# Patient Record
Sex: Female | Born: 1990 | Race: White | Hispanic: No | Marital: Single | Smoking: Never smoker
Health system: Southern US, Community
[De-identification: ages and names within clinical notes are randomized; demographics above are authoritative.]

## PROBLEM LIST (undated history)

## (undated) DIAGNOSIS — F410 Panic disorder [episodic paroxysmal anxiety] without agoraphobia: Secondary | ICD-10-CM

## (undated) DIAGNOSIS — F419 Anxiety disorder, unspecified: Secondary | ICD-10-CM

## (undated) DIAGNOSIS — G4700 Insomnia, unspecified: Secondary | ICD-10-CM

## (undated) DIAGNOSIS — F909 Attention-deficit hyperactivity disorder, unspecified type: Secondary | ICD-10-CM

## (undated) DIAGNOSIS — K5900 Constipation, unspecified: Secondary | ICD-10-CM

## (undated) DIAGNOSIS — N83209 Unspecified ovarian cyst, unspecified side: Secondary | ICD-10-CM

## (undated) DIAGNOSIS — F32A Depression, unspecified: Secondary | ICD-10-CM

## (undated) HISTORY — PX: APPENDECTOMY: SHX54

## (undated) HISTORY — PX: ABDOMINAL SURGERY: SHX537

## (undated) HISTORY — PX: APPENDECTOMY (OPEN): SHX54

## (undated) HISTORY — DX: Depression, unspecified: F32.A

## (undated) HISTORY — DX: Attention-deficit hyperactivity disorder, unspecified type: F90.9

---
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
## 2011-01-29 DIAGNOSIS — N949 Unspecified condition associated with female genital organs and menstrual cycle: Secondary | ICD-10-CM | POA: Insufficient documentation

## 2011-01-30 ENCOUNTER — Emergency Department (INDEPENDENT_AMBULATORY_CARE_PROVIDER_SITE_OTHER): Payer: BC Managed Care – PPO

## 2011-01-30 ENCOUNTER — Emergency Department (HOSPITAL_BASED_OUTPATIENT_CLINIC_OR_DEPARTMENT_OTHER)
Admission: EM | Admit: 2011-01-30 | Discharge: 2011-01-30 | Disposition: A | Discharge status: Home / Self Care | Payer: BC Managed Care – PPO | Attending: Emergency Medicine | Admitting: Emergency Medicine

## 2011-01-30 ENCOUNTER — Encounter: Payer: Self-pay | Admitting: *Deleted

## 2011-01-30 DIAGNOSIS — N949 Unspecified condition associated with female genital organs and menstrual cycle: Secondary | ICD-10-CM

## 2011-01-30 DIAGNOSIS — R102 Pelvic and perineal pain: Secondary | ICD-10-CM

## 2011-01-30 DIAGNOSIS — Z8742 Personal history of other diseases of the female genital tract: Secondary | ICD-10-CM

## 2011-01-30 HISTORY — DX: Unspecified ovarian cyst, unspecified side: N83.209

## 2011-01-30 LAB — URINALYSIS, ROUTINE W REFLEX MICROSCOPIC
Bilirubin Urine: NEGATIVE
Glucose, UA: NEGATIVE mg/dL
Ketones, ur: NEGATIVE mg/dL
pH: 6.5 (ref 5.0–8.0)

## 2011-01-30 LAB — WET PREP, GENITAL
Trich, Wet Prep: NONE SEEN
Yeast Wet Prep HPF POC: NONE SEEN

## 2011-01-30 LAB — PREGNANCY, URINE: Preg Test, Ur: NEGATIVE

## 2011-01-30 MED ORDER — MORPHINE SULFATE 4 MG/ML IJ SOLN
4.0000 mg | Freq: Once | INTRAMUSCULAR | Status: AC
  Administered 2011-01-30: 4 mg via INTRAVENOUS
  Filled 2011-01-30: qty 1

## 2011-01-30 MED ORDER — MORPHINE SULFATE 4 MG/ML IJ SOLN
4.0000 mg | Freq: Once | INTRAMUSCULAR | Status: AC
  Administered 2011-01-30: 4 mg via INTRAVENOUS

## 2011-01-30 MED ORDER — ONDANSETRON HCL 4 MG/2ML IJ SOLN
4.0000 mg | Freq: Once | INTRAMUSCULAR | Status: AC
  Administered 2011-01-30: 4 mg via INTRAVENOUS

## 2011-01-30 MED ORDER — HYDROCODONE-ACETAMINOPHEN 5-500 MG PO TABS: 1.0000 | ORAL_TABLET | Freq: Four times a day (QID) | ORAL | Status: AC | PRN

## 2011-01-30 MED ORDER — ONDANSETRON HCL 4 MG/2ML IJ SOLN
INTRAMUSCULAR | Status: AC
  Filled 2011-01-30: qty 2

## 2011-01-30 MED ORDER — ONDANSETRON 8 MG PO TBDP: 8.0000 mg | ORAL_TABLET | Freq: Three times a day (TID) | ORAL | Status: AC | PRN

## 2011-01-30 MED ORDER — MORPHINE SULFATE 4 MG/ML IJ SOLN
INTRAMUSCULAR | Status: AC
  Filled 2011-01-30: qty 1

## 2011-01-30 NOTE — ED Provider Notes (Signed)
History     CSN: [id number] Arrival date & time: 01/30/2011 12:29 AM   First MD Initiated Contact with Patient 01/30/11 [id number]      Chief Complaint  Patient presents with  . Abdominal Pain    (Consider location/radiation/quality/duration/timing/severity/associated sxs/prior treatment) HPI Complains of low abdominal pain gradual in onset yesterday afternoon. Associated symptoms are  nausea no fever no anorexia pain moderate to severe quality of pain is similar to prior ovarian cysts or endometriosis nonradiating no vaginal discharge treated with ibuprofen with partial relief. Nothing makes symptoms better or worse. No other associated symptoms Past Medical History  Diagnosis Date  . Ovarian cyst   . Endometriosis     Past Surgical History  Procedure Date  . Appendectomy   . Abdominal surgery     for endometriosis    No family history on file.  History  Substance Use Topics  . Smoking status: Never Smoker   . Smokeless tobacco: Not on file  . Alcohol Use: Yes     occasional    OB History    Grav Para Term Preterm Abortions TAB SAB Ect Mult Living                  Review of Systems  Constitutional: Negative.   HENT: Negative.   Respiratory: Negative.   Cardiovascular: Negative.   Gastrointestinal: Positive for abdominal pain.  Musculoskeletal: Negative.   Skin: Negative.   Neurological: Negative.   Hematological: Negative.   Psychiatric/Behavioral: Negative.   All other systems reviewed and are negative.    Allergies  Oxycodone  Home Medications   Current Outpatient Rx  Name Route Sig Dispense Refill  . LEVONORGEST-ETH ESTRAD 91-DAY 0.15-0.03 MG PO TABS Oral Take 1 tablet by mouth daily.        BP 114/74  Pulse 67  Temp(Src) 98.5 F (36.9 C) (Oral)  Resp 16  Ht 5\' 4"  (1.626 m)  Wt 140 lb (63.504 kg)  BMI 24.03 kg/m2  SpO2 100%  LMP 01/17/2011  Physical Exam  Nursing note and vitals reviewed. Constitutional: She appears well-developed and  well-nourished.  HENT:  Head: Normocephalic and atraumatic.  Eyes: Conjunctivae are normal. Pupils are equal, round, and reactive to light.  Neck: Neck supple. No tracheal deviation present. No thyromegaly present.  Cardiovascular: Normal rate and regular rhythm.   No murmur heard. Pulmonary/Chest: Effort normal and breath sounds normal.  Abdominal: Soft. Bowel sounds are normal. She exhibits no distension. There is no tenderness.  Genitourinary:       No external lesion tiny amount of blood in vaginal vault cervical os closed no cervical motion tenderness bilateral adnexal tenderness no definite masses  Musculoskeletal: Normal range of motion. She exhibits no edema and no tenderness.  Neurological: She is alert. Coordination normal.  Skin: Skin is warm and dry. No rash noted.  Psychiatric: She has a normal mood and affect.    ED Course  Procedures (including critical care time) 3:50 AM pain and nausea improved after treatment with intravenous morphine and Zofran. Patient resting comfortably  Labs Reviewed  PREGNANCY, URINE  URINALYSIS, ROUTINE W REFLEX MICROSCOPIC   No results found.   No diagnosis found.  7:45 AM Patient resting comfortably after treatment with intravenous morphine . Declines further pain medicine presently. Nausea has resolved  MDM  Pelvic ultrasound ordered to rule out infection cyst or torsion Patient signed out to Dr.Ray 7:45 AM Diagnosis pelvic pain        Doug Sou, MD 01/30/11 [phone]

## 2011-01-30 NOTE — ED Notes (Signed)
Visitor out in hall asking for help this RN in to check on pt pt sitting up in bed tearful stating" Im freaking out Im having a panic attack I need someone to stand in here" reassurance given to pt stood at bedside with pt comfort and reassurance offered.Marland Kitchen

## 2011-01-30 NOTE — ED Notes (Signed)
Pt medicated for pain per MD order- pending pelvic exam

## 2011-01-30 NOTE — ED Notes (Signed)
Pt reports lower abd pain since 1200- hx of ovarian cyst- nausea

## 2011-02-17 ENCOUNTER — Emergency Department
Admission: EM | Admit: 2011-02-17 | Disposition: A | Discharge status: Home / Self Care | Payer: Self-pay | Source: Emergency Department | Admitting: Emergency Medicine

## 2011-02-17 LAB — CBC AND DIFFERENTIAL
BASOPHILS %: 0.2 % (ref 0.0–2.0)
Baso(Absolute): 0.03 10*3/uL (ref 0.00–0.20)
Eosinophils %: 0.1 % (ref 0.0–6.0)
Eosinophils Absolute: 0.02 10*3/uL — ABNORMAL LOW (ref 0.10–0.30)
Hematocrit: 40.9 % (ref 27.0–49.5)
Hemoglobin: 14.5 g/dL (ref 11.7–15.5)
Lymphocytes Absolute: 2.08 10*3/uL (ref 1.00–4.80)
Lymphocytes Relative: 12.7 % — ABNORMAL LOW (ref 25.0–55.0)
MCH: 30.5 pg (ref 27.0–34.0)
MCHC: 35.5 g/dL (ref 32.0–36.0)
MCV: 85.9 fL (ref 80–100)
MPV: 8.7 fL — ABNORMAL LOW (ref 9.0–13.0)
Monocytes Absolute: 0.79 10*3/uL (ref 0.10–1.20)
Monocytes Relative %: 4.8 % (ref 1.0–8.0)
Neutrophils Absolute: 13.43 10*3/uL — ABNORMAL HIGH (ref 1.80–7.70)
Neutrophils Relative %: 82.2 % — ABNORMAL HIGH (ref 49.0–69.0)
Platelets: 334 10*3/uL (ref 150–400)
RBC: 4.76 M/uL (ref 3.80–5.40)
RDW: 12.5 % (ref 11.0–14.0)
WBC: 16.35 10*3/uL — ABNORMAL HIGH (ref 4.80–10.80)

## 2011-02-17 LAB — COMPREHENSIVE METABOLIC PANEL
ALT: 29 U/L (ref 7–56)
AST (SGOT): 25 U/L (ref 10–40)
Albumin: 4.5 g/dL (ref 3.9–5.0)
Alkaline Phosphatase: 83 U/L (ref 38–126)
BUN / Creatinine Ratio: 17 (ref 8–20)
BUN: 11 mg/dL (ref 6–20)
Bilirubin, Total: 0.8 mg/dL (ref 0.2–1.3)
CO2: 22 mmol/L (ref 21.0–31.0)
Calcium: 9.4 mg/dL (ref 8.4–10.2)
Chloride: 104 mmol/L (ref 101–111)
Creatinine: 0.66 mg/dL (ref 0.52–1.04)
EGFR: >60 mL/min/{1.73_m2}
EGFR: >60 mL/min/{1.73_m2}
Glucose: 85 mg/dL (ref 70–100)
Potassium: 3.9 mmol/L (ref 3.6–5.0)
Protein, Total: 7.6 g/dL (ref 6.3–8.2)
Sodium: 141 mmol/L (ref 135–145)

## 2011-02-17 LAB — URINALYSIS WITH MICROSCOPIC

## 2011-02-17 LAB — URINALYSIS
Bilirubin, UA: NEGATIVE
Blood, UA: NEGATIVE
Glucose, UA: NEGATIVE
Nitrate: NEGATIVE
Protein, UR: NEGATIVE
Specific Gravity, UR: 1.018 (ref 1.000–1.035)
Urobilinogen, UA: NORMAL
pH, Urine: 6 (ref 5.0–8.0)

## 2011-02-17 LAB — MORPH REVIEW
Cell Morphology:: NORMAL
Platelet Evaluation: NORMAL

## 2011-02-17 LAB — HCG, SERUM, QUALITATIVE: Hcg Qualitative: NEGATIVE

## 2011-07-25 ENCOUNTER — Ambulatory Visit
Admission: RE | Admit: 2011-07-25 | Discharge: 2011-07-25 | Disposition: A | Discharge status: Home / Self Care | Payer: No Typology Code available for payment source | Source: Ambulatory Visit | Attending: Medical | Admitting: Medical

## 2011-07-25 ENCOUNTER — Other Ambulatory Visit: Payer: Self-pay | Admitting: Medical

## 2011-07-25 DIAGNOSIS — K5909 Other constipation: Secondary | ICD-10-CM

## 2011-07-25 DIAGNOSIS — K5900 Constipation, unspecified: Secondary | ICD-10-CM | POA: Insufficient documentation

## 2011-11-25 LAB — ECG 12-LEAD
Atrial Rate: 104 {beats}/min
Atrial Rate: 109 {beats}/min
P Axis: 39 degrees
P Axis: 56 degrees
P-R Interval: 128 ms
P-R Interval: 134 ms
Q-T Interval: 354 ms
Q-T Interval: 358 ms
QRS Duration: 80 ms
QRS Duration: 82 ms
QTC Calculation (Bezet): 470 ms
QTC Calculation (Bezet): 476 ms
R Axis: 59 degrees
R Axis: 59 degrees
T Axis: 36 degrees
T Axis: 37 degrees
Ventricular Rate: 104 {beats}/min
Ventricular Rate: 109 {beats}/min

## 2012-10-19 ENCOUNTER — Emergency Department (HOSPITAL_BASED_OUTPATIENT_CLINIC_OR_DEPARTMENT_OTHER): Payer: 59

## 2012-10-19 ENCOUNTER — Encounter (HOSPITAL_BASED_OUTPATIENT_CLINIC_OR_DEPARTMENT_OTHER): Payer: Self-pay

## 2012-10-19 ENCOUNTER — Emergency Department (HOSPITAL_BASED_OUTPATIENT_CLINIC_OR_DEPARTMENT_OTHER)
Admission: EM | Admit: 2012-10-19 | Discharge: 2012-10-19 | Disposition: A | Discharge status: Home / Self Care | Payer: 59 | Attending: Emergency Medicine | Admitting: Emergency Medicine

## 2012-10-19 DIAGNOSIS — Z79899 Other long term (current) drug therapy: Secondary | ICD-10-CM | POA: Insufficient documentation

## 2012-10-19 DIAGNOSIS — M898X1 Other specified disorders of bone, shoulder: Secondary | ICD-10-CM

## 2012-10-19 DIAGNOSIS — Z791 Long term (current) use of non-steroidal anti-inflammatories (NSAID): Secondary | ICD-10-CM | POA: Insufficient documentation

## 2012-10-19 DIAGNOSIS — Z8742 Personal history of other diseases of the female genital tract: Secondary | ICD-10-CM | POA: Insufficient documentation

## 2012-10-19 DIAGNOSIS — M25519 Pain in unspecified shoulder: Secondary | ICD-10-CM | POA: Insufficient documentation

## 2012-10-19 MED ORDER — HYDROCODONE-ACETAMINOPHEN 5-325 MG PO TABS
2.0000 | ORAL_TABLET | Freq: Once | ORAL | Status: AC
  Administered 2012-10-19: 2 via ORAL
  Filled 2012-10-19: qty 2

## 2012-10-19 MED ORDER — HYDROCODONE-ACETAMINOPHEN 5-325 MG PO TABS: 2.0000 | ORAL_TABLET | ORAL | Status: AC | PRN

## 2012-10-19 MED ORDER — ONDANSETRON 4 MG PO TBDP: 4.0000 mg | ORAL_TABLET | Freq: Three times a day (TID) | ORAL | Status: DC | PRN

## 2012-10-19 MED ORDER — ONDANSETRON 4 MG PO TBDP
4.0000 mg | ORAL_TABLET | Freq: Once | ORAL | Status: AC
  Administered 2012-10-19: 4 mg via ORAL
  Filled 2012-10-19: qty 1

## 2012-10-19 NOTE — ED Notes (Signed)
Patient here with upper back pain x 2 weeks, denies injury. Seen at an urgent care earlier in week and no better with meds.

## 2012-10-19 NOTE — ED Provider Notes (Signed)
CSN: [id number]     Arrival date & time 10/19/12  1918 History     First MD Initiated Contact with Patient 10/19/12 2135     Chief Complaint  Patient presents with  . Back Pain   (Consider location/radiation/quality/duration/timing/severity/associated sxs/prior Treatment) HPI Comments: Patient is a 22 year old female with a 2 week history of right scapular pain. Patient reports gradual onset of pain that is progressively worsening since the onset. The pain is aching and moderate without radiation. Patient denies any injury to the area. Patient denies any associated symptoms. Movement and palpation makes the pain worse. Nothing makes the pain better.    Past Medical History  Diagnosis Date  . Ovarian cyst   . Endometriosis    Past Surgical History  Procedure Laterality Date  . Appendectomy    . Abdominal surgery      for endometriosis   No family history on file. History  Substance Use Topics  . Smoking status: Never Smoker   . Smokeless tobacco: Not on file  . Alcohol Use: Yes     Comment: occasional   OB History   Grav Para Term Preterm Abortions TAB SAB Ect Mult Living                 Review of Systems  Musculoskeletal: Positive for back pain.  All other systems reviewed and are negative.    Allergies  Oxycodone  Home Medications   Current Outpatient Rx  Name  Route  Sig  Dispense  Refill  . amphetamine-dextroamphetamine (ADDERALL XR) 30 MG 24 hr capsule   Oral   Take 30 mg by mouth every morning.         Marland Kitchen FLUoxetine (PROZAC) 40 MG capsule   Oral   Take 40 mg by mouth daily.         . meloxicam (MOBIC) 15 MG tablet   Oral   Take 15 mg by mouth daily.         . traMADol (ULTRAM) 50 MG tablet   Oral   Take 50 mg by mouth every 6 (six) hours as needed for pain.         Marland Kitchen levonorgestrel-ethinyl estradiol (SEASONALE,INTROVALE,JOLESSA) 0.15-0.03 MG tablet   Oral   Take 1 tablet by mouth daily.            BP 125/78  Pulse 102  Temp(Src)  98 F (36.7 C) (Oral)  Resp 16  SpO2 100%  LMP 10/13/2012 Physical Exam  Nursing note and vitals reviewed. Constitutional: She is oriented to person, place, and time. She appears well-developed and well-nourished. No distress.  HENT:  Head: Normocephalic and atraumatic.  Eyes: Conjunctivae are normal.  Neck: Normal range of motion.  Cardiovascular: Normal rate and regular rhythm.  Exam reveals no gallop and no friction rub.   No murmur heard. Pulmonary/Chest: Effort normal and breath sounds normal. She has no wheezes. She has no rales. She exhibits no tenderness.  Musculoskeletal: Normal range of motion.  Right scapular tenderness to palpation. No obvious deformity or bruising noted. Full ROM of right shoulder.   Neurological: She is alert and oriented to person, place, and time. Coordination normal.  Upper extremity strength and sensation equal and intact bilaterally. Speech is goal-oriented. Moves limbs without ataxia.   Skin: Skin is warm and dry.  Psychiatric: She has a normal mood and affect. Her behavior is normal.    ED Course   Procedures (including critical care time)  Labs Reviewed - No  data to display Dg Scapula Right  10/19/2012   CLINICAL DATA:  Right scapular and upper back pain. No known injury.  EXAM: RIGHT SCAPULA - 2+ VIEWS  COMPARISON:  None.  FINDINGS: Normal-appearing scapula and adjacent bones and soft tissues.  IMPRESSION: Normal examination.   Electronically Signed   By: Gordan Payment   On: 10/19/2012 22:18   1. Pain of right scapula     MDM  10:54 PM Xray unremarkable for acute changes. Patient reports improvement of pain after Vicodin and zofran. No neurovascular compromise noted. Patient will be discharged with instructions to follow up with an Orthopedic doctor.   Emilia Beck, PA-C 10/19/12 2330

## 2012-10-19 NOTE — ED Provider Notes (Signed)
Medical screening examination/treatment/procedure(s) were performed by non-physician practitioner and as supervising physician I was immediately available for consultation/collaboration.  Shanie Mauzy M Kristy Schomburg, MD 10/19/12 2349 

## 2012-10-28 ENCOUNTER — Emergency Department (HOSPITAL_BASED_OUTPATIENT_CLINIC_OR_DEPARTMENT_OTHER)
Admission: EM | Admit: 2012-10-28 | Discharge: 2012-10-28 | Disposition: A | Discharge status: Home / Self Care | Payer: 59 | Attending: Emergency Medicine | Admitting: Emergency Medicine

## 2012-10-28 ENCOUNTER — Emergency Department (HOSPITAL_BASED_OUTPATIENT_CLINIC_OR_DEPARTMENT_OTHER): Payer: 59

## 2012-10-28 ENCOUNTER — Encounter (HOSPITAL_BASED_OUTPATIENT_CLINIC_OR_DEPARTMENT_OTHER): Payer: Self-pay | Admitting: *Deleted

## 2012-10-28 DIAGNOSIS — M25512 Pain in left shoulder: Secondary | ICD-10-CM

## 2012-10-28 DIAGNOSIS — Z791 Long term (current) use of non-steroidal anti-inflammatories (NSAID): Secondary | ICD-10-CM | POA: Insufficient documentation

## 2012-10-28 DIAGNOSIS — M542 Cervicalgia: Secondary | ICD-10-CM | POA: Insufficient documentation

## 2012-10-28 DIAGNOSIS — Z79899 Other long term (current) drug therapy: Secondary | ICD-10-CM | POA: Insufficient documentation

## 2012-10-28 DIAGNOSIS — Z8742 Personal history of other diseases of the female genital tract: Secondary | ICD-10-CM | POA: Insufficient documentation

## 2012-10-28 DIAGNOSIS — M25519 Pain in unspecified shoulder: Secondary | ICD-10-CM | POA: Insufficient documentation

## 2012-10-28 MED ORDER — NAPROXEN 500 MG PO TABS: 500.0000 mg | ORAL_TABLET | Freq: Two times a day (BID) | ORAL | Status: AC

## 2012-10-28 MED ORDER — METHOCARBAMOL 500 MG PO TABS: 500.0000 mg | ORAL_TABLET | Freq: Two times a day (BID) | ORAL | Status: AC | PRN

## 2012-10-28 MED ORDER — DEXAMETHASONE SODIUM PHOSPHATE 10 MG/ML IJ SOLN
10.0000 mg | Freq: Once | INTRAMUSCULAR | Status: AC
  Administered 2012-10-28: 10 mg via INTRAVENOUS

## 2012-10-28 MED ORDER — SULFAMETHOXAZOLE-TRIMETHOPRIM 800-160 MG PO TABS: 1.0000 | ORAL_TABLET | Freq: Two times a day (BID) | ORAL | Status: AC

## 2012-10-28 MED ORDER — ONDANSETRON HCL 4 MG/2ML IJ SOLN
4.0000 mg | Freq: Once | INTRAMUSCULAR | Status: AC
  Administered 2012-10-28: 4 mg via INTRAVENOUS
  Filled 2012-10-28: qty 2

## 2012-10-28 MED ORDER — IOHEXOL 350 MG/ML SOLN
80.0000 mL | Freq: Once | INTRAVENOUS | Status: AC | PRN
  Administered 2012-10-28: 80 mL via INTRAVENOUS

## 2012-10-28 MED ORDER — KETOROLAC TROMETHAMINE 60 MG/2ML IM SOLN
60.0000 mg | Freq: Once | INTRAMUSCULAR | Status: AC
  Administered 2012-10-28: 60 mg via INTRAMUSCULAR
  Filled 2012-10-28: qty 2

## 2012-10-28 MED ORDER — MORPHINE SULFATE 4 MG/ML IJ SOLN
4.0000 mg | Freq: Once | INTRAMUSCULAR | Status: AC
  Administered 2012-10-28: 4 mg via INTRAVENOUS
  Filled 2012-10-28: qty 1

## 2012-10-28 MED ORDER — DEXAMETHASONE SODIUM PHOSPHATE 10 MG/ML IJ SOLN
10.0000 mg | Freq: Once | INTRAMUSCULAR | Status: DC
  Filled 2012-10-28 (×2): qty 1

## 2012-10-28 NOTE — ED Provider Notes (Signed)
CSN: [id number]     Arrival date & time 10/28/12  1756 History  This chart was scribed for Vida Roller, MD by Leone Payor, ED Scribe. This patient was seen in room MH01/MH01 and the patient's care was started 6:19 PM.    Chief Complaint  Patient presents with  . Shoulder Pain    The history is provided by the patient. No language interpreter was used.    HPI Comments: Michelle Santos is a 22 y.o. female who presents to the Emergency Department complaining of gradual onset, constant left shoulder pain that worsened today. Pt states she initially had right shoulder pain for which she was seen on 10/19/12. She has associated neck pain from cracking her neck. She states she has never been able to crack any of her joints prior to this episode. She moves and cracks her left shoulder to help relieve the pain. She denies pain to any other joints in the body. Pt had long distance travel to [place] in 04/2012. Pt denies HTN, DM.   Sx are persistent, moderate to severe, relieved with movement of the shoulder and "popping and cracking" the shoulder with ROM.  No numness / weakness of the RUE.  Past Medical History  Diagnosis Date  . Ovarian cyst   . Endometriosis    Past Surgical History  Procedure Laterality Date  . Appendectomy    . Abdominal surgery      for endometriosis   No family history on file. History  Substance Use Topics  . Smoking status: Never Smoker   . Smokeless tobacco: Not on file  . Alcohol Use: Yes     Comment: occasional   OB History   Grav Para Term Preterm Abortions TAB SAB Ect Mult Living                 Review of Systems A complete 10 system review of systems was obtained and all systems are negative except as noted in the HPI and PMH.   Allergies  Oxycodone  Home Medications   Current Outpatient Rx  Name  Route  Sig  Dispense  Refill  . amphetamine-dextroamphetamine (ADDERALL XR) 30 MG 24 hr capsule   Oral   Take 30 mg by mouth every morning.          Marland Kitchen FLUoxetine (PROZAC) 40 MG capsule   Oral   Take 40 mg by mouth daily.         Marland Kitchen HYDROcodone-acetaminophen (NORCO/VICODIN) 5-325 MG per tablet   Oral   Take 2 tablets by mouth every 4 (four) hours as needed for pain.   12 tablet   0   . levonorgestrel-ethinyl estradiol (SEASONALE,INTROVALE,JOLESSA) 0.15-0.03 MG tablet   Oral   Take 1 tablet by mouth daily.           . meloxicam (MOBIC) 15 MG tablet   Oral   Take 15 mg by mouth daily.         . methocarbamol (ROBAXIN) 500 MG tablet   Oral   Take 1 tablet (500 mg total) by mouth 2 (two) times daily as needed.   20 tablet   0   . naproxen (NAPROSYN) 500 MG tablet   Oral   Take 1 tablet (500 mg total) by mouth 2 (two) times daily with a meal.   30 tablet   0   . ondansetron (ZOFRAN ODT) 4 MG disintegrating tablet   Oral   Take 1 tablet (4 mg total) by mouth every 8 (  eight) hours as needed for nausea.   10 tablet   0   . traMADol (ULTRAM) 50 MG tablet   Oral   Take 50 mg by mouth every 6 (six) hours as needed for pain.          BP 132/102  Pulse 120  Temp(Src) 98.6 F (37 C) (Oral)  Resp 18  Wt 140 lb (63.504 kg)  BMI 24.02 kg/m2  SpO2 100%  LMP 10/13/2012 Physical Exam  Nursing note and vitals reviewed. Constitutional: She is oriented to person, place, and time. She appears well-developed and well-nourished. No distress.  HENT:  Head: Normocephalic and atraumatic.  Mouth/Throat: Oropharynx is clear and moist.  Eyes: Conjunctivae are normal. Pupils are equal, round, and reactive to light. No scleral icterus.  Neck: Neck supple.  Cardiovascular: Regular rhythm, normal heart sounds and intact distal pulses.   No murmur heard. tachycardic  Pulmonary/Chest: Effort normal and breath sounds normal. No stridor. No respiratory distress. She has no rales.  Abdominal: Soft. Bowel sounds are normal. She exhibits no distension. There is no tenderness.  Musculoskeletal: Normal range of motion.  L shoulder with  pain with palpation posteriorly, no swelling, no redness, normal ROM, no ttp with use of rotator cuff.  + pan with axial load on neck.  Normal grip  Neurological: She is alert and oriented to person, place, and time.  Normal strength and sensation distal to L shoulder .  Skin: Skin is warm and dry. No rash noted.  Psychiatric: She has a normal mood and affect. Her behavior is normal.    ED Course  Procedures (including critical care time)  DIAGNOSTIC STUDIES: Oxygen Saturation is 100% on RA, normal by my interpretation.    COORDINATION OF CARE: 6:34 PM Discussed treatment plan with pt at bedside and pt agreed to plan.   Labs Review Labs Reviewed - No data to display Imaging Review Ct Angio Chest Pe W/cm &/or Wo Cm  10/28/2012   *RADIOLOGY REPORT*  Clinical Data: Shoulder pain, tachycardia, chest tightness  CT ANGIOGRAPHY CHEST  Technique:  Multidetector CT imaging of the chest using the standard protocol during bolus administration of intravenous contrast. Multiplanar reconstructed images including MIPs were obtained and reviewed to evaluate the vascular anatomy.  Contrast: 80mL OMNIPAQUE IOHEXOL 350 MG/ML SOLN  Comparison: None.  Findings: No significant filling defect or pulmonary embolus by CTA.  Pulmonary arteries appear patent.  Intact thoracic aorta. Negative for aneurysm or dissection.  Residual thymic tissue in the anterior mediastinum.  No adenopathy.  Normal heart size.  No pericardial or pleural effusion.  No acute finding in the upper abdomen demonstrated.  Lung windows demonstrate clear lungs.  No focal airspace process, collapse, consolidation, or interstitial process.  Trachea and airways are patent.  IMPRESSION: Negative for significant acute pulmonary embolus.  No acute intrathoracic process   Original Report Authenticated By: Judie Petit. Miles Costain, M.D.   Dg Shoulder Left  10/28/2012   CLINICAL DATA:  22 year old female with pain.  EXAM: LEFT SHOULDER - 2+ VIEW  COMPARISON:  None.   FINDINGS: Bone mineralization is within normal limits. No glenohumeral joint dislocation. Proximal left humerus intact. Left clavicle and scapula intact. Negative visualized ribs and lung parenchyma.  IMPRESSION: Negative.   Electronically Signed   By: Augusto Gamble   On: 10/28/2012 19:03    MDM   1. Shoulder pain, left    Pt has no hypoxia, no swelling and direct pain in the L shoulder which appears to be revlieved  with movement of the L shoulder.    Xray of shoulder negative - CTA neg for PE - tachycardia likely related to anxiety, adderal and pain - she is stable for d/c.  Meds given in ED:  Medications  dexamethasone (DECADRON) injection 10 mg (not administered)  ketorolac (TORADOL) injection 60 mg (60 mg Intramuscular Given 10/28/12 1902)  iohexol (OMNIPAQUE) 350 MG/ML injection 80 mL (80 mLs Intravenous Contrast Given 10/28/12 2047)    New Prescriptions   METHOCARBAMOL (ROBAXIN) 500 MG TABLET    Take 1 tablet (500 mg total) by mouth 2 (two) times daily as needed.   NAPROXEN (NAPROSYN) 500 MG TABLET    Take 1 tablet (500 mg total) by mouth 2 (two) times daily with a meal.      I personally performed the services described in this documentation, which was scribed in my presence. The recorded information has been reviewed and is accurate.      Vida Roller, MD 10/28/12 2103

## 2012-10-28 NOTE — ED Notes (Addendum)
Left shoulder pain. Constantly moving her shoulder in a circular motion. Very talkative. Pupils dilated. Denies drug addiction or substance abuse. VS noted.

## 2013-03-11 ENCOUNTER — Encounter (HOSPITAL_BASED_OUTPATIENT_CLINIC_OR_DEPARTMENT_OTHER): Payer: Self-pay | Admitting: Emergency Medicine

## 2013-03-11 ENCOUNTER — Emergency Department (HOSPITAL_BASED_OUTPATIENT_CLINIC_OR_DEPARTMENT_OTHER)
Admission: EM | Admit: 2013-03-11 | Discharge: 2013-03-11 | Disposition: A | Discharge status: Home / Self Care | Payer: 59 | Attending: Emergency Medicine | Admitting: Emergency Medicine

## 2013-03-11 DIAGNOSIS — T43605A Adverse effect of unspecified psychostimulants, initial encounter: Secondary | ICD-10-CM | POA: Insufficient documentation

## 2013-03-11 DIAGNOSIS — Z8719 Personal history of other diseases of the digestive system: Secondary | ICD-10-CM | POA: Insufficient documentation

## 2013-03-11 DIAGNOSIS — T43205A Adverse effect of unspecified antidepressants, initial encounter: Secondary | ICD-10-CM | POA: Insufficient documentation

## 2013-03-11 DIAGNOSIS — T4275XA Adverse effect of unspecified antiepileptic and sedative-hypnotic drugs, initial encounter: Secondary | ICD-10-CM | POA: Insufficient documentation

## 2013-03-11 DIAGNOSIS — F909 Attention-deficit hyperactivity disorder, unspecified type: Secondary | ICD-10-CM | POA: Insufficient documentation

## 2013-03-11 DIAGNOSIS — Z8742 Personal history of other diseases of the female genital tract: Secondary | ICD-10-CM | POA: Insufficient documentation

## 2013-03-11 DIAGNOSIS — G2402 Drug induced acute dystonia: Secondary | ICD-10-CM | POA: Insufficient documentation

## 2013-03-11 DIAGNOSIS — Z3202 Encounter for pregnancy test, result negative: Secondary | ICD-10-CM | POA: Insufficient documentation

## 2013-03-11 DIAGNOSIS — F410 Panic disorder [episodic paroxysmal anxiety] without agoraphobia: Secondary | ICD-10-CM | POA: Insufficient documentation

## 2013-03-11 DIAGNOSIS — Z791 Long term (current) use of non-steroidal anti-inflammatories (NSAID): Secondary | ICD-10-CM | POA: Insufficient documentation

## 2013-03-11 DIAGNOSIS — Z79899 Other long term (current) drug therapy: Secondary | ICD-10-CM | POA: Insufficient documentation

## 2013-03-11 DIAGNOSIS — R000 Tachycardia, unspecified: Secondary | ICD-10-CM | POA: Insufficient documentation

## 2013-03-11 HISTORY — DX: Anxiety disorder, unspecified: F41.9

## 2013-03-11 HISTORY — DX: Constipation, unspecified: K59.00

## 2013-03-11 HISTORY — DX: Panic disorder (episodic paroxysmal anxiety): F41.0

## 2013-03-11 HISTORY — DX: Insomnia, unspecified: G47.00

## 2013-03-11 HISTORY — DX: Attention-deficit hyperactivity disorder, unspecified type: F90.9

## 2013-03-11 LAB — RAPID URINE DRUG SCREEN, HOSP PERFORMED
Amphetamines: POSITIVE — AB
BENZODIAZEPINES: NOT DETECTED
Barbiturates: NOT DETECTED
Cocaine: NOT DETECTED
Opiates: NOT DETECTED
TETRAHYDROCANNABINOL: POSITIVE — AB

## 2013-03-11 LAB — CBC WITH DIFFERENTIAL/PLATELET
Basophils Absolute: 0 10*3/uL (ref 0.0–0.1)
Basophils Relative: 0 % (ref 0–1)
Eosinophils Absolute: 0.1 10*3/uL (ref 0.0–0.7)
Eosinophils Relative: 1 % (ref 0–5)
HCT: 42 % (ref 36.0–46.0)
HEMOGLOBIN: 15.2 g/dL — AB (ref 12.0–15.0)
LYMPHS ABS: 2.8 10*3/uL (ref 0.7–4.0)
Lymphocytes Relative: 29 % (ref 12–46)
MCH: 31.9 pg (ref 26.0–34.0)
MCHC: 36.2 g/dL — ABNORMAL HIGH (ref 30.0–36.0)
MCV: 88.2 fL (ref 78.0–100.0)
Monocytes Absolute: 1 10*3/uL (ref 0.1–1.0)
Monocytes Relative: 11 % (ref 3–12)
NEUTROS ABS: 5.8 10*3/uL (ref 1.7–7.7)
NEUTROS PCT: 59 % (ref 43–77)
Platelets: 331 10*3/uL (ref 150–400)
RBC: 4.76 MIL/uL (ref 3.87–5.11)
RDW: 11.9 % (ref 11.5–15.5)
WBC: 9.7 10*3/uL (ref 4.0–10.5)

## 2013-03-11 LAB — SALICYLATE LEVEL: Salicylate Lvl: <2 mg/dL — ABNORMAL LOW (ref 2.8–20.0)

## 2013-03-11 LAB — ACETAMINOPHEN LEVEL: Acetaminophen (Tylenol), Serum: <15 ug/mL (ref 10–30)

## 2013-03-11 LAB — COMPREHENSIVE METABOLIC PANEL
ALK PHOS: 58 U/L (ref 39–117)
ALT: 15 U/L (ref 0–35)
AST: 20 U/L (ref 0–37)
Albumin: 4.7 g/dL (ref 3.5–5.2)
BUN: 14 mg/dL (ref 6–23)
CHLORIDE: 102 meq/L (ref 96–112)
CO2: 25 mEq/L (ref 19–32)
Calcium: 9.8 mg/dL (ref 8.4–10.5)
Creatinine, Ser: 0.7 mg/dL (ref 0.50–1.10)
GFR calc non Af Amer: >90 mL/min (ref >90)
GLUCOSE: 92 mg/dL (ref 70–99)
POTASSIUM: 3.8 meq/L (ref 3.7–5.3)
Sodium: 141 mEq/L (ref 137–147)
Total Bilirubin: 1.2 mg/dL (ref 0.3–1.2)
Total Protein: 8 g/dL (ref 6.0–8.3)

## 2013-03-11 LAB — CK: Total CK: 164 U/L (ref 7–177)

## 2013-03-11 LAB — ETHANOL

## 2013-03-11 LAB — PREGNANCY, URINE: PREG TEST UR: NEGATIVE

## 2013-03-11 MED ORDER — SODIUM CHLORIDE 0.9 % IV SOLN
1000.0000 mL | Freq: Once | INTRAVENOUS | Status: AC
  Administered 2013-03-11: 1000 mL via INTRAVENOUS

## 2013-03-11 NOTE — ED Provider Notes (Signed)
CSN: [id number]     Arrival date & time 03/11/13  0331 History   First MD Initiated Contact with Patient 03/11/13 0344     Chief Complaint  Patient presents with  . Leg Problem   (Consider location/radiation/quality/duration/timing/severity/associated sxs/prior Treatment) Patient is a 23 y.o. female presenting with Ingested Medication. The history is provided by the patient and a friend. The history is limited by the condition of the patient (forgertful and under the influence of mind altering substances).  Ingestion This is a new problem. Episode onset: unknown. The problem occurs constantly. The problem has not changed since onset.Pertinent negatives include no chest pain, no abdominal pain, no headaches and no shortness of breath. Nothing aggravates the symptoms. Nothing relieves the symptoms. Treatments tried: took more meds. The treatment provided no relief.  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  States she took her usual adderall, prozac and then zalepon and then felt bad and went to sleep boyfriend woke her up and said he reportedly couldn't sleep with her because her legs were moving.  It was only then that she noticed it so she then took [place] and xanax and tried to go back to bed but then the symptoms got worse.  So she called a friend who brought her in.  She denies alcohol and any illicit drugs including marijuana.     Past Medical History  Diagnosis Date  . Ovarian cyst   . Endometriosis   . Constipation   . Insomnia   . Anxiety   . ADHD (attention deficit hyperactivity disorder)   . Panic disorder    Past Surgical History  Procedure Laterality Date  . Appendectomy    . Abdominal surgery      for endometriosis   No family history on file. History  Substance Use Topics  . Smoking status: Never Smoker   . Smokeless tobacco: Not on file  . Alcohol Use: Yes     Comment: occasional   OB  History   Grav Para Term Preterm Abortions TAB SAB Ect Mult Living                 Review of Systems  Constitutional: Negative for fever.  Respiratory: Negative for shortness of breath.   Cardiovascular: Negative for chest pain.  Gastrointestinal: Negative for abdominal pain.  Musculoskeletal: Negative for arthralgias, gait problem, joint swelling, myalgias, neck pain and neck stiffness.  Neurological: Negative for headaches.  All other systems reviewed and are negative.    Allergies  Oxycodone  Home Medications   Current Outpatient Rx  Name  Route  Sig  Dispense  Refill  . ALPRAZolam (XANAX) 0.25 MG tablet   Oral   Take 0.25 mg by mouth at bedtime as needed for anxiety.         . medroxyPROGESTERone (DEPO-PROVERA) 150 MG/ML injection   Intramuscular   Inject 150 mg into the muscle every 3 (three) months.         . traZODone (DESYREL) 100 MG tablet   Oral   Take 100 mg by mouth at bedtime.         Marland Kitchen. zolpidem (AMBIEN) 10 MG tablet   Oral   Take 10 mg by mouth at bedtime as needed for sleep.         Marland Kitchen. amphetamine-dextroamphetamine (ADDERALL XR) 30 MG 24 hr capsule   Oral   Take 30 mg by mouth every morning.         .Marland Kitchen  FLUoxetine (PROZAC) 40 MG capsule   Oral   Take 40 mg by mouth daily.         Marland Kitchen HYDROcodone-acetaminophen (NORCO/VICODIN) 5-325 MG per tablet   Oral   Take 2 tablets by mouth every 4 (four) hours as needed for pain.   12 tablet   0   . levonorgestrel-ethinyl estradiol (SEASONALE,INTROVALE,JOLESSA) 0.15-0.03 MG tablet   Oral   Take 1 tablet by mouth daily.           . meloxicam (MOBIC) 15 MG tablet   Oral   Take 15 mg by mouth daily.         . methocarbamol (ROBAXIN) 500 MG tablet   Oral   Take 1 tablet (500 mg total) by mouth 2 (two) times daily as needed.   20 tablet   0   . naproxen (NAPROSYN) 500 MG tablet   Oral   Take 1 tablet (500 mg total) by mouth 2 (two) times daily with a meal.   30 tablet   0   .  ondansetron (ZOFRAN ODT) 4 MG disintegrating tablet   Oral   Take 1 tablet (4 mg total) by mouth every 8 (eight) hours as needed for nausea.   10 tablet   0   . sulfamethoxazole-trimethoprim (SEPTRA DS) 800-160 MG per tablet   Oral   Take 1 tablet by mouth 2 (two) times daily.   6 tablet   0   . traMADol (ULTRAM) 50 MG tablet   Oral   Take 50 mg by mouth every 6 (six) hours as needed for pain.          BP 158/81  Pulse 124  Temp(Src) 98.6 F (37 C) (Oral)  Resp 22  Ht 5\' 4"  (1.626 m)  Wt 135 lb (61.236 kg)  BMI 23.16 kg/m2  SpO2 100%  LMP 03/10/2013 Physical Exam  Constitutional: She appears well-developed and well-nourished.  HENT:  Head: Normocephalic and atraumatic.  Mouth/Throat: Oropharynx is clear and moist.  Eyes: Conjunctivae and EOM are normal.  Pupils dilated at 9 mm  Neck: Normal range of motion. Neck supple.  Cardiovascular: Regular rhythm.  Tachycardia present.   Pulmonary/Chest: Effort normal and breath sounds normal. No respiratory distress. She has no wheezes. She has no rales.  Abdominal: Soft. Bowel sounds are normal. There is no tenderness. There is no rebound and no guarding.  Musculoskeletal: Normal range of motion. She exhibits no edema and no tenderness.  Neurological: She is alert. She has normal reflexes. No cranial nerve deficit. She exhibits normal muscle tone. Coordination normal.  No rigidity, no clonus no hyperreflexia  Skin: Skin is warm and dry.  Psychiatric:  Seems sleepy    ED Course  Procedures (including critical care time) Labs Review Labs Reviewed  CBC WITH DIFFERENTIAL - Abnormal; Notable for the following:    Hemoglobin 15.2 (*)    MCHC 36.2 (*)    All other components within normal limits  SALICYLATE LEVEL - Abnormal; Notable for the following:    Salicylate Lvl <2.0 (*)    All other components within normal limits  URINE RAPID DRUG SCREEN (HOSP PERFORMED) - Abnormal; Notable for the following:    Amphetamines  POSITIVE (*)    Tetrahydrocannabinol POSITIVE (*)    All other components within normal limits  COMPREHENSIVE METABOLIC PANEL  ETHANOL  ACETAMINOPHEN LEVEL  CK  PREGNANCY, URINE   Imaging Review No results found.  EKG Interpretation   None  MDM  No diagnosis found.  Date: 03/11/2013  Rate: 103  Rhythm: sinus tachycardia  QRS Axis: normal  Intervals: prolongation of the QTC  ST/T Wave abnormalities: normal  Conduction Disutrbances:none  Narrative Interpretation:   Old EKG Reviewed: none available  Somnolent. Will monitor.  Will continue IVF.  Poison control contacted.  Monitor reflexes if not somnolent anymore consider benzos.    No clonus no hyperreflexia no rigidity on repeat exam.  Sleeping in room but easily arousable, movements have stopped.    Cleared by poison control after 3rd exam and vitals  Exam repeat for a third time.  Patient is awake and alert is back to normal per patient.  EDP stated no zofran, no taking your boyfriend's zapelon, no ambien and trazedone and marijuana and all the other medications.  Patient verbalizes understanding and agrees to follow up  Improved QTC post IV hydration  Naoko Diperna K Ahnyla Mendel-Rasch, MD 03/11/13 [phone]

## 2013-03-11 NOTE — ED Notes (Signed)
Patient states that she feels better.  Negative for clonus or hyperreflexia.  MD at bedside

## 2013-03-11 NOTE — ED Notes (Signed)
Called poison control and spoke with Onalee Huaavid...  Regarding patient's ingestion.

## 2013-03-11 NOTE — ED Notes (Signed)
Patient is resting comfortably.  Cardiac monitor on, patient O2 sat. 99& R/A.  Still have some involuntary right arm movement.  MD aware

## 2013-03-11 NOTE — ED Notes (Signed)
Pt complains of uncontrollable leg and arm movements.

## 2014-01-05 ENCOUNTER — Emergency Department: Payer: No Typology Code available for payment source

## 2014-01-05 ENCOUNTER — Emergency Department
Admission: EM | Admit: 2014-01-05 | Discharge: 2014-01-05 | Disposition: A | Discharge status: Home / Self Care | Payer: No Typology Code available for payment source | Attending: Emergency Medicine | Admitting: Emergency Medicine

## 2014-01-05 DIAGNOSIS — R000 Tachycardia, unspecified: Secondary | ICD-10-CM | POA: Insufficient documentation

## 2014-01-05 DIAGNOSIS — IMO0001 Reserved for inherently not codable concepts without codable children: Secondary | ICD-10-CM

## 2014-01-05 DIAGNOSIS — R030 Elevated blood-pressure reading, without diagnosis of hypertension: Secondary | ICD-10-CM | POA: Insufficient documentation

## 2014-01-05 LAB — COMPREHENSIVE METABOLIC PANEL
ALT: 17 U/L (ref 0–55)
AST (SGOT): 22 U/L (ref 5–34)
Albumin/Globulin Ratio: 1.6 (ref 0.9–2.2)
Albumin: 4.3 g/dL (ref 3.5–5.0)
Alkaline Phosphatase: 61 U/L (ref 37–106)
Anion Gap: 10 (ref 5.0–15.0)
BUN: 8.9 mg/dL (ref 7.0–19.0)
Bilirubin, Total: 0.9 mg/dL (ref 0.2–1.2)
CO2: 24 mEq/L (ref 22–29)
Calcium: 9.7 mg/dL (ref 8.5–10.5)
Chloride: 104 mEq/L (ref 100–111)
Creatinine: 0.8 mg/dL (ref 0.6–1.0)
Globulin: 2.7 g/dL (ref 2.0–3.6)
Glucose: 108 mg/dL — ABNORMAL HIGH (ref 70–100)
Potassium: 3.7 mEq/L (ref 3.5–5.1)
Protein, Total: 7 g/dL (ref 6.0–8.3)
Sodium: 138 mEq/L (ref 136–145)

## 2014-01-05 LAB — ECG 12-LEAD
Atrial Rate: 100 {beats}/min
P Axis: 58 degrees
P-R Interval: 122 ms
Q-T Interval: 364 ms
QRS Duration: 82 ms
QTC Calculation (Bezet): 469 ms
R Axis: 77 degrees
T Axis: 59 degrees
Ventricular Rate: 100 {beats}/min

## 2014-01-05 LAB — GFR: EGFR: >60

## 2014-01-05 LAB — CBC AND DIFFERENTIAL
Basophils Absolute Automated: 0.02 10*3/uL (ref 0.00–0.20)
Basophils Automated: 0 %
Eosinophils Absolute Automated: 0.07 10*3/uL (ref 0.00–0.70)
Eosinophils Automated: 1 %
Hematocrit: 40.9 % (ref 37.0–47.0)
Hgb: 14.7 g/dL (ref 12.0–16.0)
Immature Granulocytes Absolute: 0.04 10*3/uL
Immature Granulocytes: 0 %
Lymphocytes Absolute Automated: 2.62 10*3/uL (ref 0.50–4.40)
Lymphocytes Automated: 29 %
MCH: 32.2 pg — ABNORMAL HIGH (ref 28.0–32.0)
MCHC: 35.9 g/dL (ref 32.0–36.0)
MCV: 89.5 fL (ref 80.0–100.0)
MPV: 8.7 fL — ABNORMAL LOW (ref 9.4–12.3)
Monocytes Absolute Automated: 0.71 10*3/uL (ref 0.00–1.20)
Monocytes: 8 %
Neutrophils Absolute: 5.56 10*3/uL (ref 1.80–8.10)
Neutrophils: 62 %
Platelets: 246 10*3/uL (ref 140–400)
RBC: 4.57 10*6/uL (ref 4.20–5.40)
RDW: 12 % (ref 12–15)
WBC: 8.98 10*3/uL (ref 3.50–10.80)

## 2014-01-05 LAB — HCG, SERUM, QUALITATIVE: Hcg Qualitative: NEGATIVE

## 2014-01-05 LAB — IHS D-DIMER: D-Dimer: <0.27 ug/mL FEU (ref 0.00–0.51)

## 2014-01-05 MED ORDER — ONDANSETRON HCL 4 MG/2ML IJ SOLN
4.0000 mg | Freq: Once | INTRAMUSCULAR | Status: AC
Start: 2014-01-05 — End: 2014-01-05
  Administered 2014-01-05: 4 mg via INTRAVENOUS
  Filled 2014-01-05: qty 2

## 2014-01-05 NOTE — ED Notes (Signed)
Pt reports nausea after getting iv flushed and now having "anxiety" about vomiting. ED MD made aware.

## 2014-01-05 NOTE — ED Notes (Signed)
Reports feeling shortness of breath when she goes up stairs for 2 years since taking Adderal.

## 2014-01-05 NOTE — ED Notes (Signed)
When pt asked if she was taking nay medications for anxiety pt reported she wasn't because she was afraid she may become dependant on them.

## 2014-01-05 NOTE — Discharge Instructions (Signed)
Hypertension     You have been diagnosed with elevated blood pressure.     The medical term for high blood pressure is hypertension. Many people feel anxious or uncomfortable about being at the hospital. If you feel anxious today, this could make your blood pressure appear high, even if your blood pressure is usually normal. Check your blood pressure several more times when you are not feeling stress. Keep a record of these readings and give this information to your regular doctor. He or she will decide whether you have hypertension that requires medical treatment.     If your blood pressure becomes extremely high all of a sudden, you will probably notice symptoms. In fact, very high blood pressure is a medical emergency. Most people with hypertension have blood pressure that is only a little too high. Mild high blood pressure does not cause specific symptoms. Instead, the effects of hypertension develop slowly over time. Untreated hypertension can affect the heart, brain, kidneys, eyes, and blood vessels. Unfortunately, by the time side-effects become noticeable, the body has already been damaged. This is why hypertension is called “the silent killer!”     It is important to follow up with your regular doctor. Check your blood pressure several times in the next 1 to 2 weeks and tell your doctor about the results. It may be helpful to keep a log or a journal where you can write down your blood pressures. Note the time of day and the activity you were doing when the reading was taken.     YOU SHOULD SEEK MEDICAL ATTENTION IMMEDIATELY, EITHER HERE OR AT THE NEAREST EMERGENCY DEPARTMENT, IF ANY OF THE FOLLOWING OCCURS:  · You have a headache.  · You have chest pain.  · You are short of breath or have trouble breathing.   · You feel weak, especially on only one side of the body.  · Your symptoms get worse or you have other concerns.         Tachycardia     You have been diagnosed with Tachycardia.     Tachycardia is the  medical term for a fast heart rate. For an adult, the heart rate is considered to be too fast if it beats more than 100 beats per minute.       There are many causes of tachycardia. Some are harmless, but others are more concerning and need more testing and/or treatment.  Some common, but not dangerous causes of tachycardia are dehydration, pain, fever, anxiety and stress or exertion (strain).      Based on your evaluation and testing, your doctor doesn t feel your tachycardia is dangerous.  However, at this time, there is no clear explanation for why your heart is beating so quickly.  In many cases, tachycardia gets better on its own.  Sometimes, however, it may be constant and a specialist has to investigate it further.     Though we don’t believe your condition is dangerous right now, it is important to be careful. Sometimes a problem that seems mild can become serious later. This is why it is very important that you return here or go to the nearest Emergency Department if you don t get better or your symptoms get worse.     Depending on how serious the tachycardia is, your doctor may prescribe medications for the symptoms.  Some of these may affect the heart.  It is very important to take these medications only as prescribed.     You may be asked to follow up with your primary care doctor for more testing.  In some cases, you   may need to see a cardiologist (heart doctor) or another specialist to help diagnose the cause of your symptoms and decide the proper treatment.  It is very important to follow up as recommended by your doctor today. This way, you are sure to get the best care.     Things that may help your symptoms include:     · Take your prescribed medications.  · Get plenty of rest.  · Drink lots of liquids.  · Stay away from drugs, alcohol and tobacco.  · Stay away from caffeine and over-the-counter cold medicines or decongestants.  · Try to lower stress.     YOU SHOULD SEEK MEDICAL ATTENTION  IMMEDIATELY, EITHER HERE OR AT THE NEAREST EMERGENCY DEPARTMENT, IF ANY OF THE FOLLOWING OCCUR:     · You have new chest pain, lightheadedness, shortness of breath, dizziness, or fainting.  · You get a fever (temperature higher than 100.4°F or 38°C).  · You get a serious headache or headache that starts suddenly.  · You have vomiting and can t keep fluids down.  · You have any other symptoms, concerns, or you are not getting better as expected.  · You get worse or feel you can t wait until your follow-up appointment to get treated.     If you can t follow up with your doctor, or if at any time you feel you need to be rechecked or seen again, come back here or go to the nearest emergency department.

## 2014-01-05 NOTE — ED Provider Notes (Signed)
Physician/Midlevel provider first contact with patient: 01/05/14 0749         History     Chief Complaint   Patient presents with   . Tachycardia     HPI Comments: This 23 year old female presents with rapid heart rate/tachycardia for the past 2 years.  Patient states it all started when she started taking Adderall for attention deficit/hyperactivity disorder.  Patient has since stopped.  The Adderall in August, but has been noticing that she gets winded when she goes upstairs.  He also had swelling yesterday in her legs after standing.  This has since resolved.  Patient had no calf or thigh tenderness with this.  He states her illogic mother has no medical problems, but the birth mother has high blood pressure.  She is not genetically related to the birth mother.  Patient has a long history of anxiety and has not seen a physician in quite a while.  Patient denies any chest pain, abdominal pain.  She states she occasionally gets numbness in her feet.  This has also resolved at this time.    Anxiety seems to make this worse and, rest improves symptoms.    The history is provided by the patient.            Past Medical History   Diagnosis Date   . ADHD (attention deficit hyperactivity disorder)    . Depression    . Depression        Past Surgical History   Procedure Laterality Date   . Appendectomy         No family history on file.    Social  History   Substance Use Topics   . Smoking status: Never Smoker    . Smokeless tobacco: Never Used   . Alcohol Use: Yes      Comment: Socially       .     Allergies   Allergen Reactions   . Oxycodone        There are no discharge medications for this patient.       Review of Systems   Constitutional: Negative for fever and diaphoresis.   HENT: Negative for congestion, ear pain and sore throat.    Eyes: Negative for pain and visual disturbance.   Respiratory: Positive for shortness of breath. Negative for cough, chest tightness and wheezing.    Cardiovascular: Negative for chest  pain, palpitations and leg swelling.   Gastrointestinal: Negative for nausea, vomiting, abdominal pain, diarrhea and constipation.   Genitourinary: Negative for dysuria and urgency.   Musculoskeletal: Negative for back pain, gait problem and neck pain.   Skin: Negative for color change and rash.   Neurological: Negative for dizziness, weakness and headaches.   Psychiatric/Behavioral: Negative for confusion. The patient is nervous/anxious.        Physical Exam    BP: 140/86 mmHg, Heart Rate: (!) 110, Temp: 97.3 F (36.3 C), Resp Rate: 18, SpO2: 98 %, Weight: 61.236 kg    Physical Exam   Constitutional: She is oriented to person, place, and time. She appears well-developed and well-nourished.   Patient appearing very anxious    HENT:   Head: Normocephalic and atraumatic.   Right Ear: External ear normal.   Left Ear: External ear normal.   Mouth/Throat: Oropharynx is clear and moist. No oropharyngeal exudate.   Eyes: Conjunctivae are normal. Pupils are equal, round, and reactive to light. Right eye exhibits no discharge. Left eye exhibits no discharge. No scleral icterus.  Neck: Normal range of motion. Neck supple. No JVD present. No tracheal deviation present.   Cardiovascular: Normal rate, regular rhythm and normal heart sounds.    No murmur heard.  Pulmonary/Chest: Effort normal and breath sounds normal. She has no wheezes. She exhibits no tenderness.   Abdominal: Soft. Bowel sounds are normal. She exhibits no distension. There is no tenderness. There is no guarding.   Musculoskeletal: She exhibits no edema or tenderness.   Neurological: She is alert and oriented to person, place, and time. Coordination normal.   Skin: Skin is warm and dry. She is not diaphoretic. No pallor.   Psychiatric: Her behavior is normal. Judgment normal. Her mood appears anxious.   Nursing note and vitals reviewed.      MDM and ED Course     ED Medication Orders     Start     Status Ordering Provider    01/05/14 0843  ondansetron (ZOFRAN)  injection 4 mg   Once     Route: Intravenous  Ordered Dose: 4 mg     Last MAR action:  Given Camera Krienke              MDM  Number of Diagnoses or Management Options  Elevated blood pressure:   Tachycardia:   Diagnosis management comments: Diagnosis management comments: Oxygen saturation by pulse oximetry is 95%-100%, Normal.  Interventions: None Needed.      Time 7:38 AM  EKG Interpretation  EKG interpreted by ED physician  Rate: Normal for age.  Rhythm: Normal sinus rhythm  Axis: Normal for age  PR, QRS and QT intervals:  normal for age and rate  ST Segments: No deviations suggestive of ischemia  Impression: Normal ECG with no evidence of ischemia.    Attending : Dr. Melvern Sample    [facility] Dx:SVT, a-fib, a-flutter, dehydration, electrolyte disorder, PE.    Multiple reexaminations: Patient is alert and oriented 3 and feeling much better.  Vital signs improved significantly.  Patient felt stable for discharge.    Patient is alert and oriented 3.  On reexamination, vital signs were stable, and patient is in no acute distress.  At this point she is stable for discharge.  Myles Gip Fournet had an opportunity to ask questions and she verbalized understanding of instructions.    I have reviewed the nursing history.    I have reviewed all available xrays and find the following results: Chest x-ray-negative    DR. Melvern Sample  is the primary attending for this patient and has obtained and performed the history, PE, and medical decision making for this patient.      Results     Procedure Component Value Units Date/Time    Comprehensive metabolic panel ([id number])  (Abnormal) Collected:  01/05/14   0801    Specimen Information:  Blood Updated:  01/05/14 0840     Glucose 108 (H) mg/dL      BUN 8.9 mg/dL      Creatinine 0.8 mg/dL      Sodium 540 mEq/L      Potassium 3.7 mEq/L      Chloride 104 mEq/L      CO2 24 mEq/L      CALCIUM 9.7 mg/dL      Protein, Total 7.0 g/dL      Albumin 4.3 g/dL      AST (SGOT) 22 U/L      ALT 17 U/L       Alkaline Phosphatase 61 U/L  Bilirubin, Total 0.9 mg/dL      Globulin 2.7 g/dL      Albumin/Globulin Ratio 1.6      Anion Gap 10.0     GFR ([id number]) Collected:  01/05/14 0801     EGFR >60.0 Updated:  01/05/14 0840    Beta HCG, Qual, Serum ([id number]) Collected:  01/05/14 0801    Specimen Information:  Blood Updated:  01/05/14 0829     Hcg Qualitative Negative     D-Dimer ([id number]) Collected:  01/05/14 0801     D-Dimer <0.27 ug/mL FEU Updated:  01/05/14 0826    CBC and differential ([id number])  (Abnormal) Collected:  01/05/14 0801    Specimen Information:  Blood / Blood Updated:  01/05/14 0816     WBC 8.98 x10 3/uL      RBC 4.57 x10 6/uL      Hgb 14.7 g/dL      Hematocrit 72.5 %      MCV 89.5 fL      MCH 32.2 (H) pg      MCHC 35.9 g/dL      RDW 12 %      Platelets 246 x10 3/uL      MPV 8.7 (L) fL      Neutrophils 62 %      Lymphocytes Automated 29 %      Monocytes 8 %      Eosinophils Automated 1 %      Basophils Automated 0 %      Immature Granulocyte 0 %      Neutrophils Absolute 5.56 x10 3/uL      Abs Lymph Automated 2.62 x10 3/uL      Abs Mono Automated 0.71 x10 3/uL      Abs Eos Automated 0.07 x10 3/uL      Absolute Baso Automated 0.02 x10 3/uL      Absolute Immature Granulocyte 0.04 x10 3/uL         Radiology Results (24 Hour)     Procedure Component Value Units Date/Time    Chest 2 Views ([id number]) Collected:  01/05/14 0922    Order Status:  Completed Updated:  01/05/14 0927    Narrative:       CLINICAL HISTORY:  Palpitations    COMPARISON:  Chest x-ray on 06/26/2007    FINDINGS:  PA and lateral views of the chest were performed.      The heart is normal in size and shape.  The lungs are clear.  There are  no masses, nodules, areas of consolidation, pleural effusion,  pneumothorax, or other abnormality.  The mediastinum, hila, osseous  structures, and overlying soft tissues are normal.         Impression:       Normal, stable appearance of the chest. The lungs are clear.    Trilby Drummer, MD   01/05/2014  9:23 AM                    Amount and/or Complexity of Data Reviewed  Clinical lab tests: ordered and reviewed  Tests in the radiology section of CPT: ordered and reviewed  Tests in the medicine section of CPT: ordered and reviewed           Procedures    Clinical Impression & Disposition     Clinical Impression  Final diagnoses:   Tachycardia   Elevated blood pressure        ED Disposition     Discharge Myles Gip Askari discharge  to home/self care.    Condition at disposition: Stable             There are no discharge medications for this patient.                Melvern Sample, DO  01/06/14 1349

## 2014-04-02 IMAGING — CR DG SCAPULA*R*
2 series · 2 of 2 positions shown · non-contrast
Comparison: None.

CLINICAL DATA: Right scapular and upper back pain. No known injury.

EXAM:
RIGHT SCAPULA - 2+ VIEWS

[w scapula ap/pa right *]
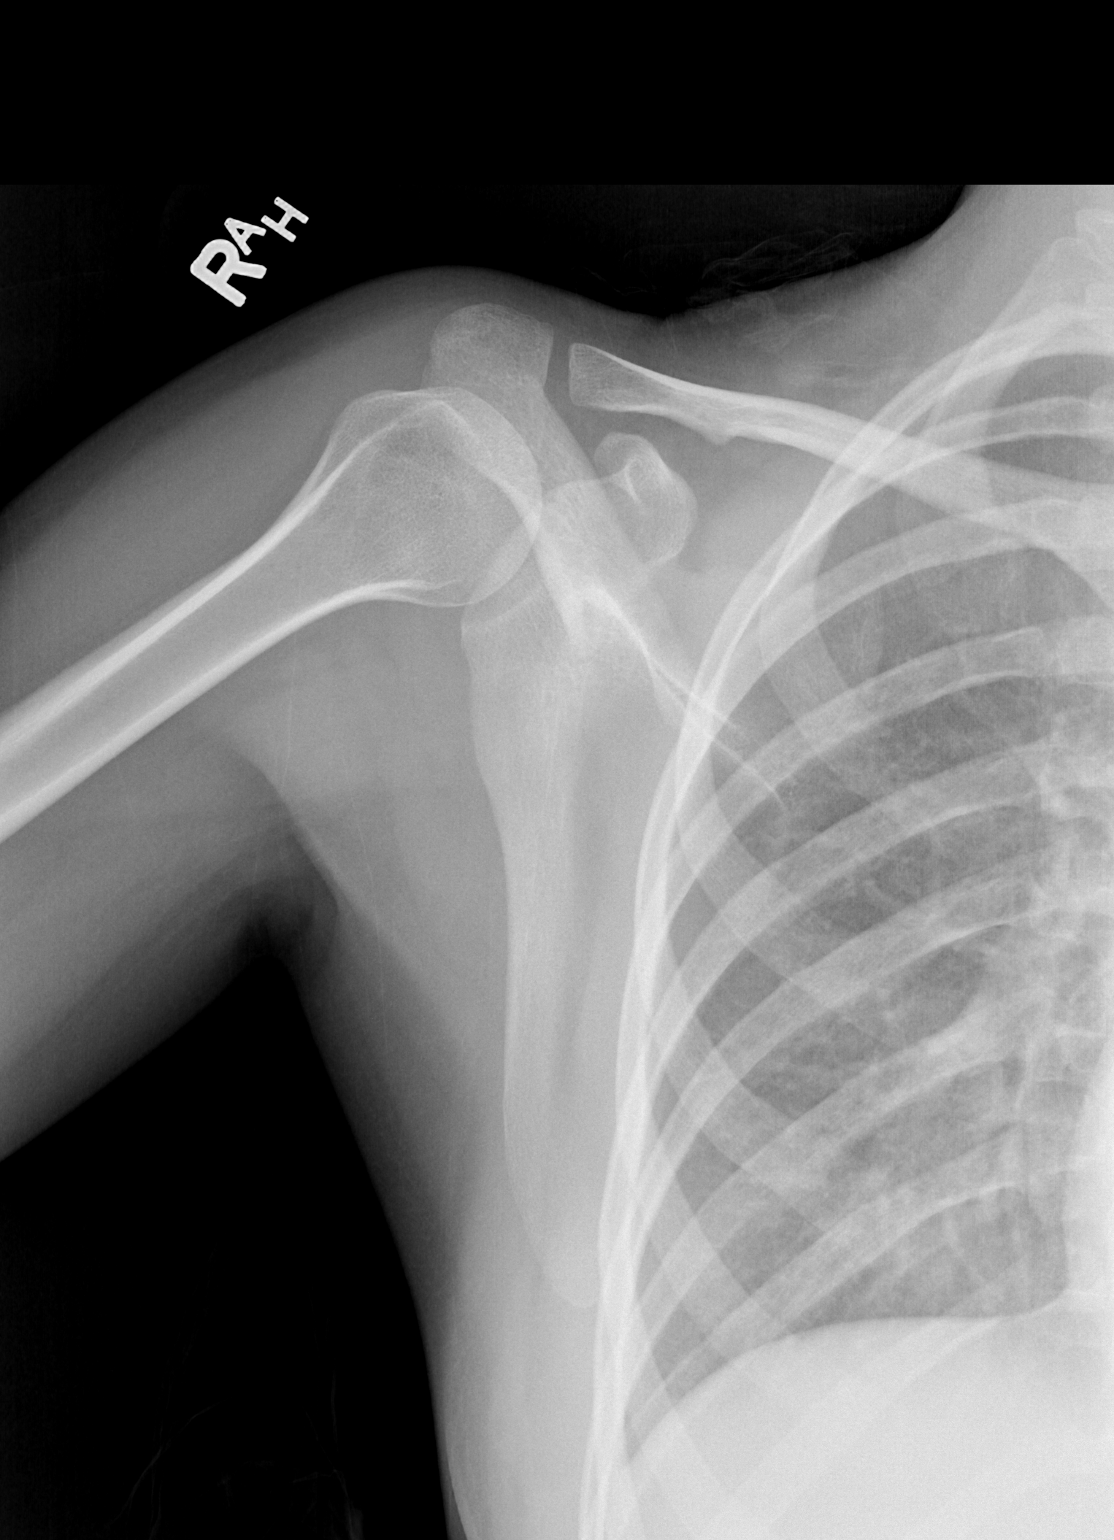

[w scapula lat right *]
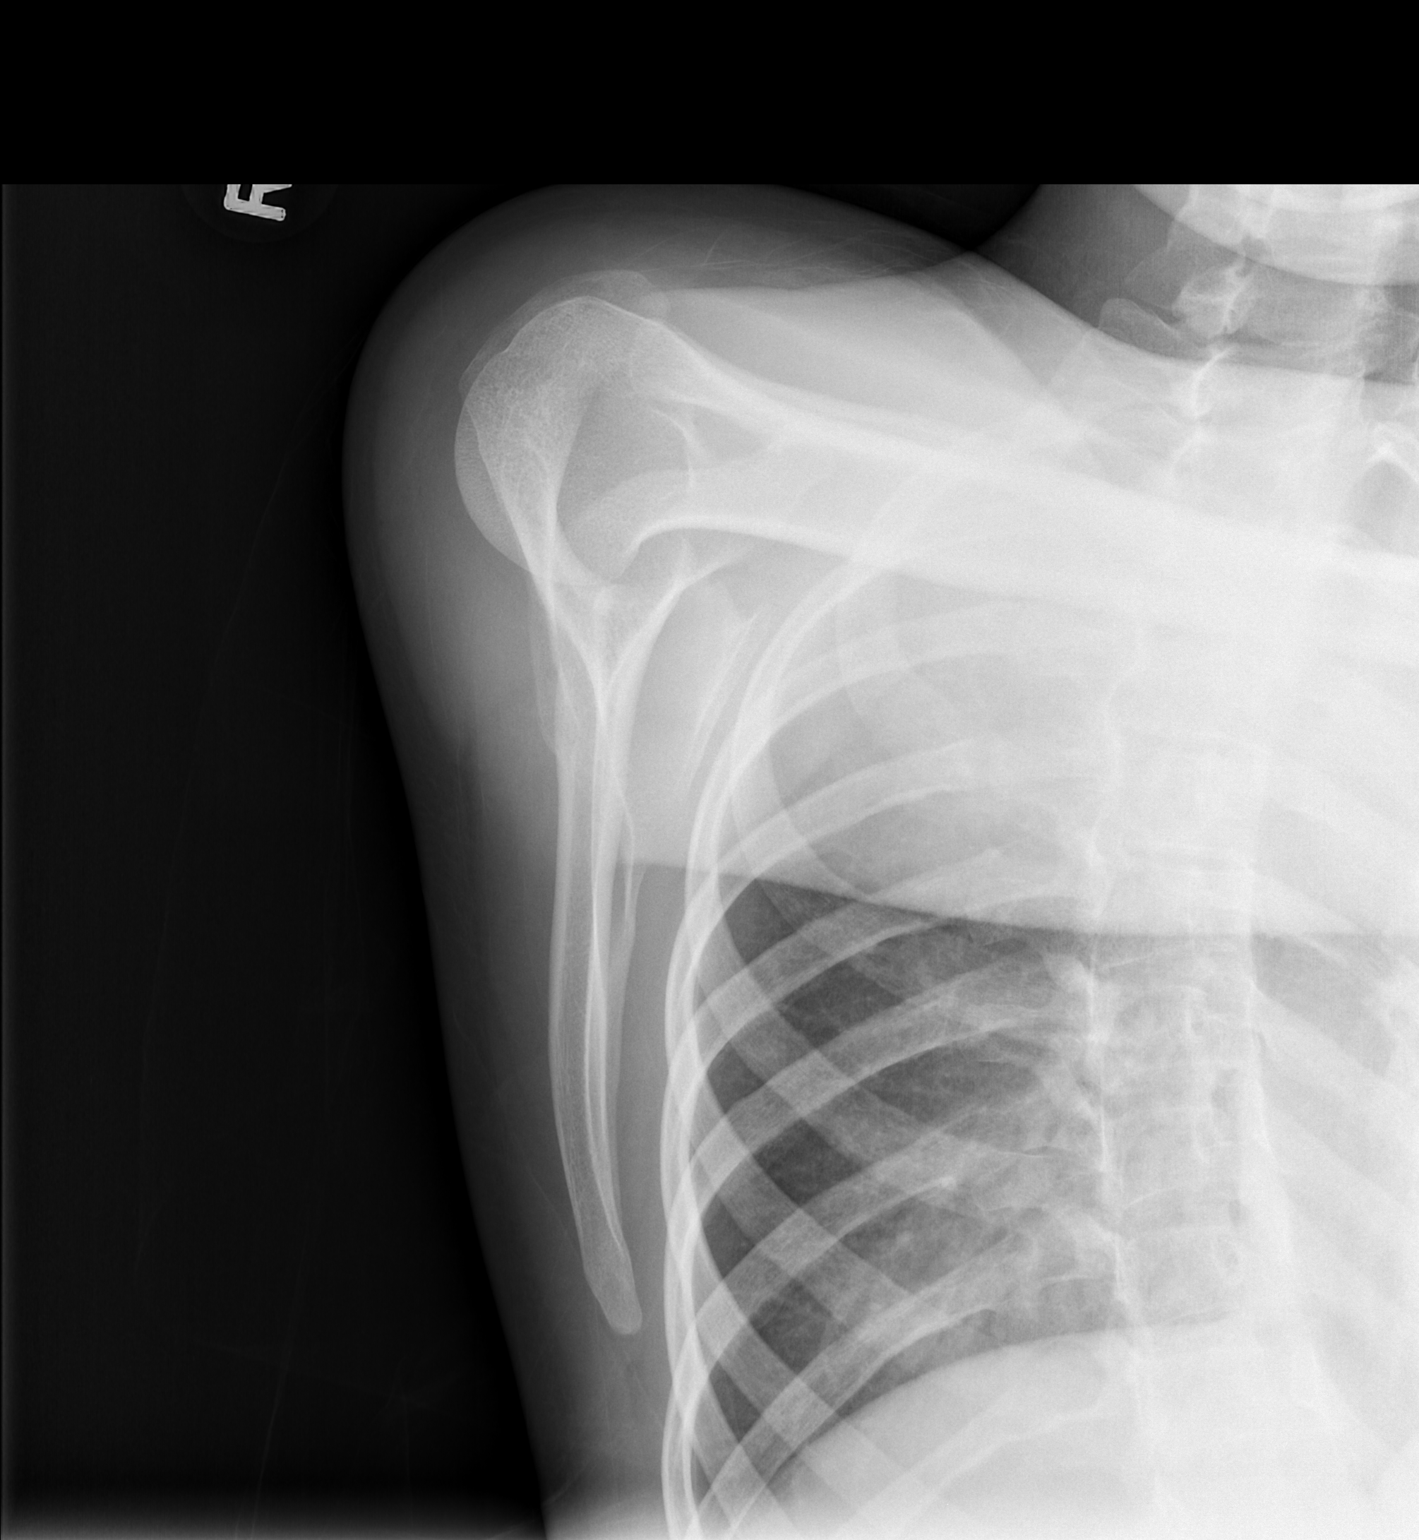

[2 of 2 positions shown; findings below may reference images not displayed]

FINDINGS: Normal-appearing scapula and adjacent bones and soft tissues.
IMPRESSION: Normal examination.

## 2014-04-11 IMAGING — CR DG SHOULDER 2+V*L*
3 series · 3 of 3 positions shown · non-contrast
Comparison: None.

CLINICAL DATA: 22-year-old female with pain.

EXAM:
LEFT SHOULDER - 2+ VIEW

[w shoulder ap internal left]
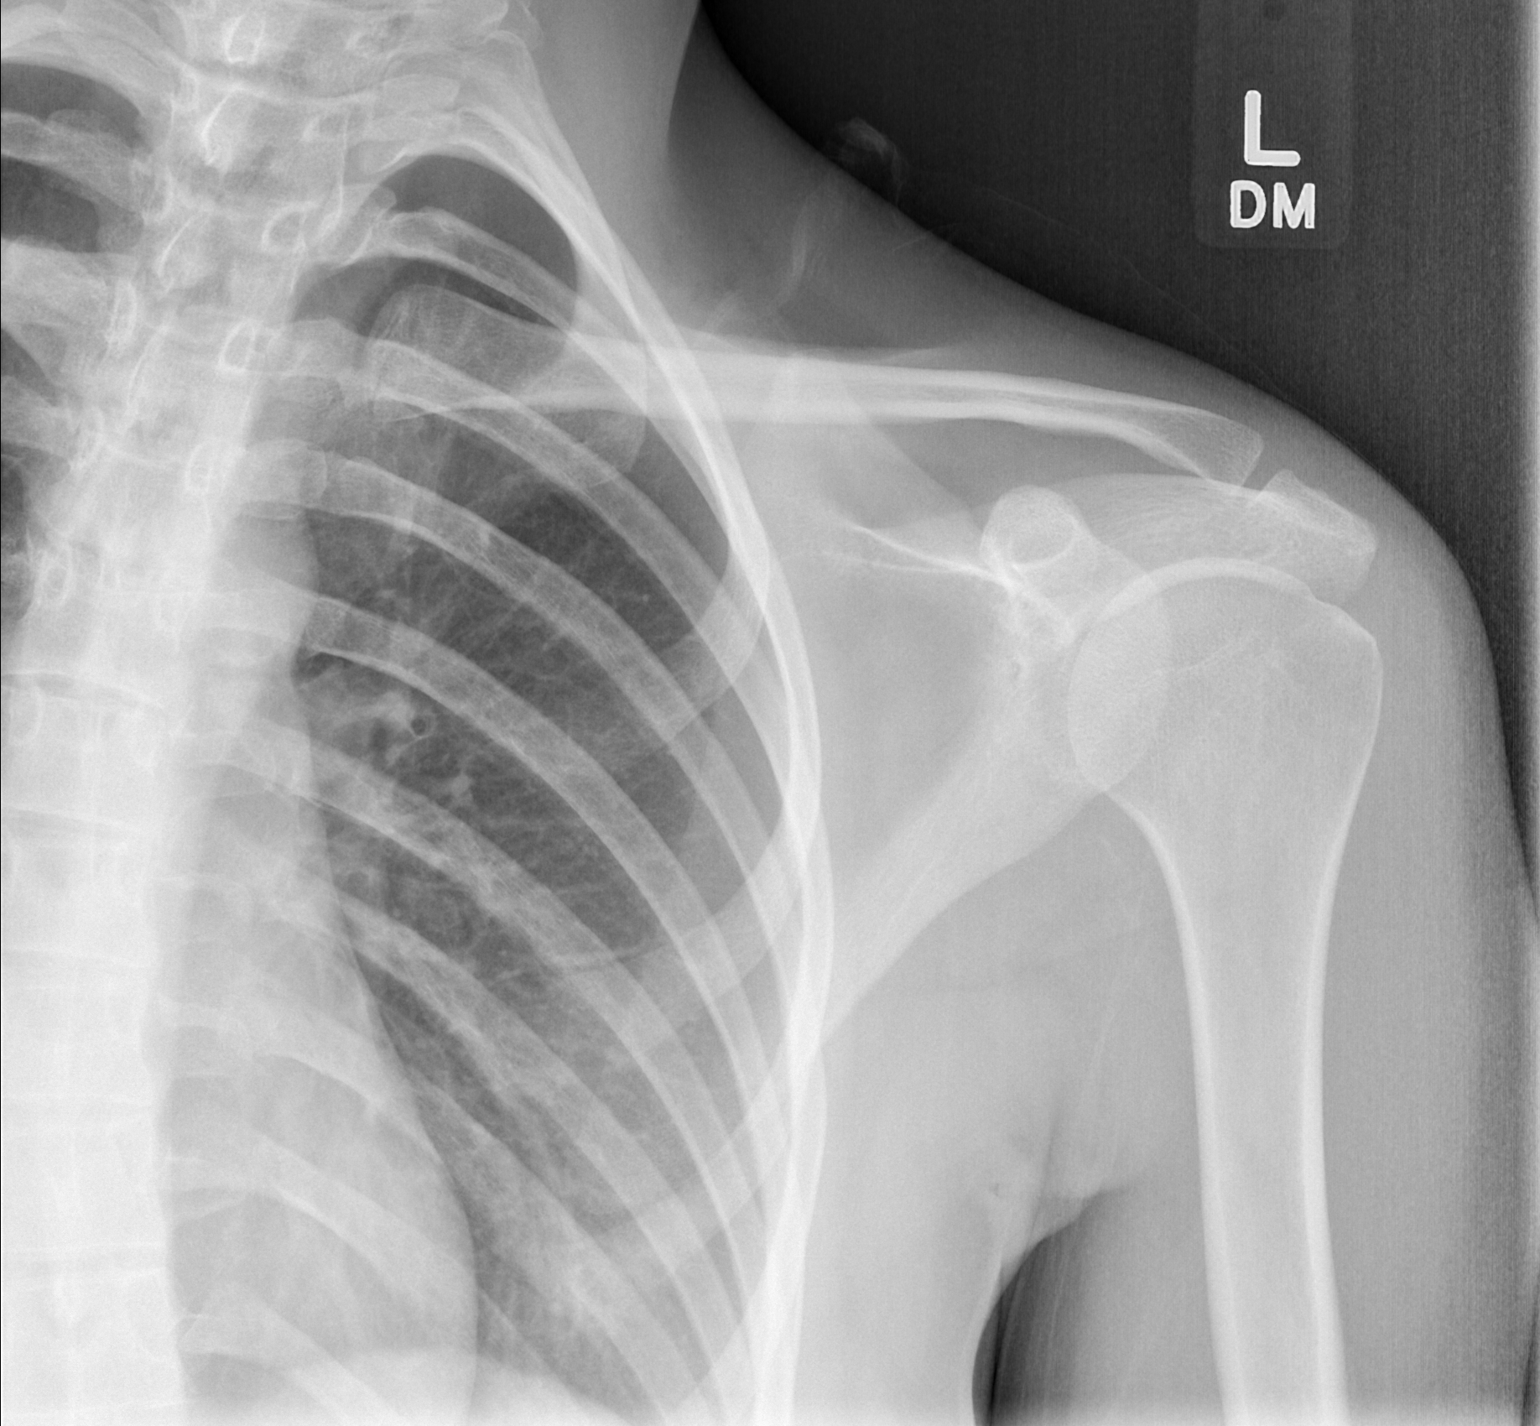

[w shoulder y view left]
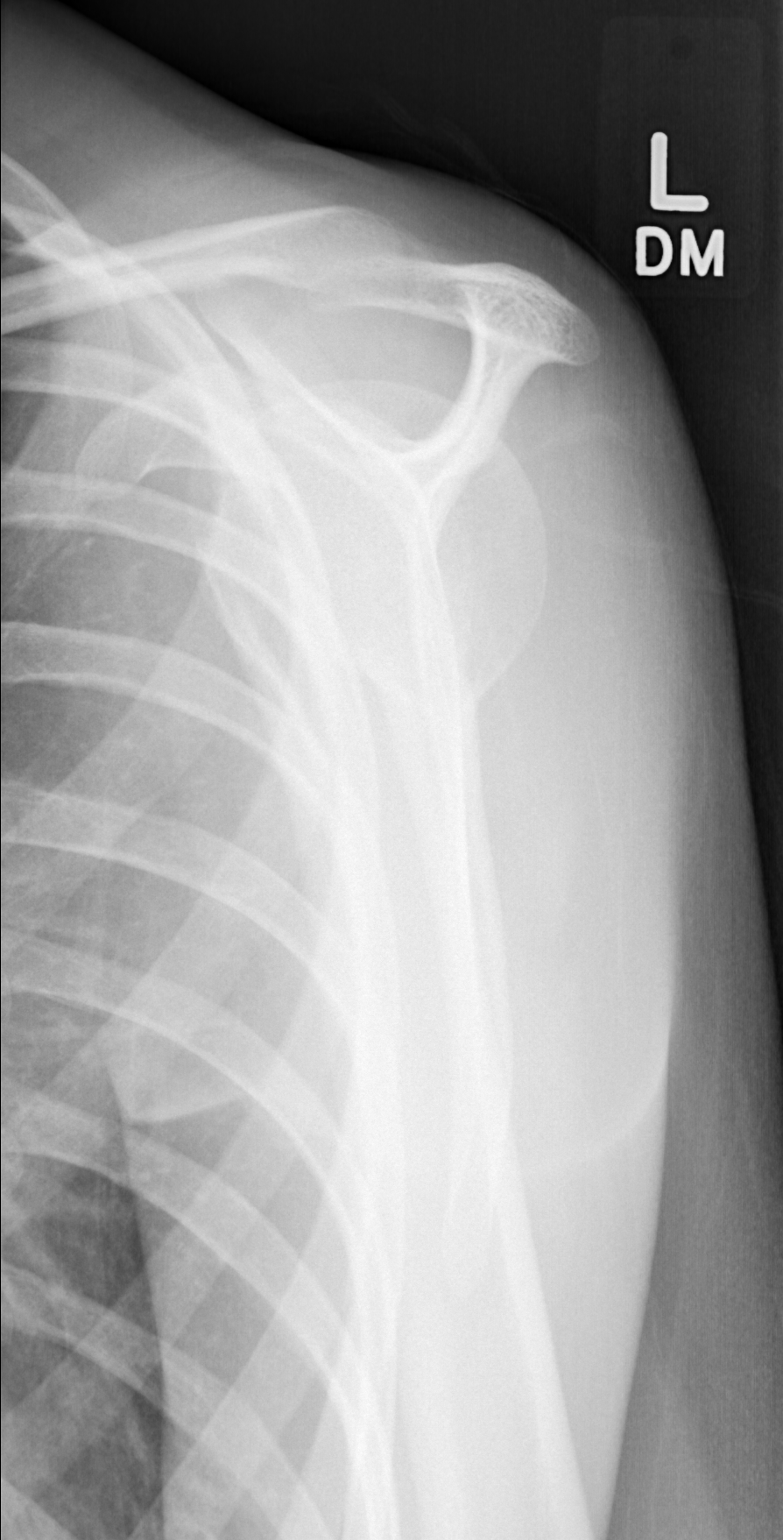

[x shoulder axillary left]
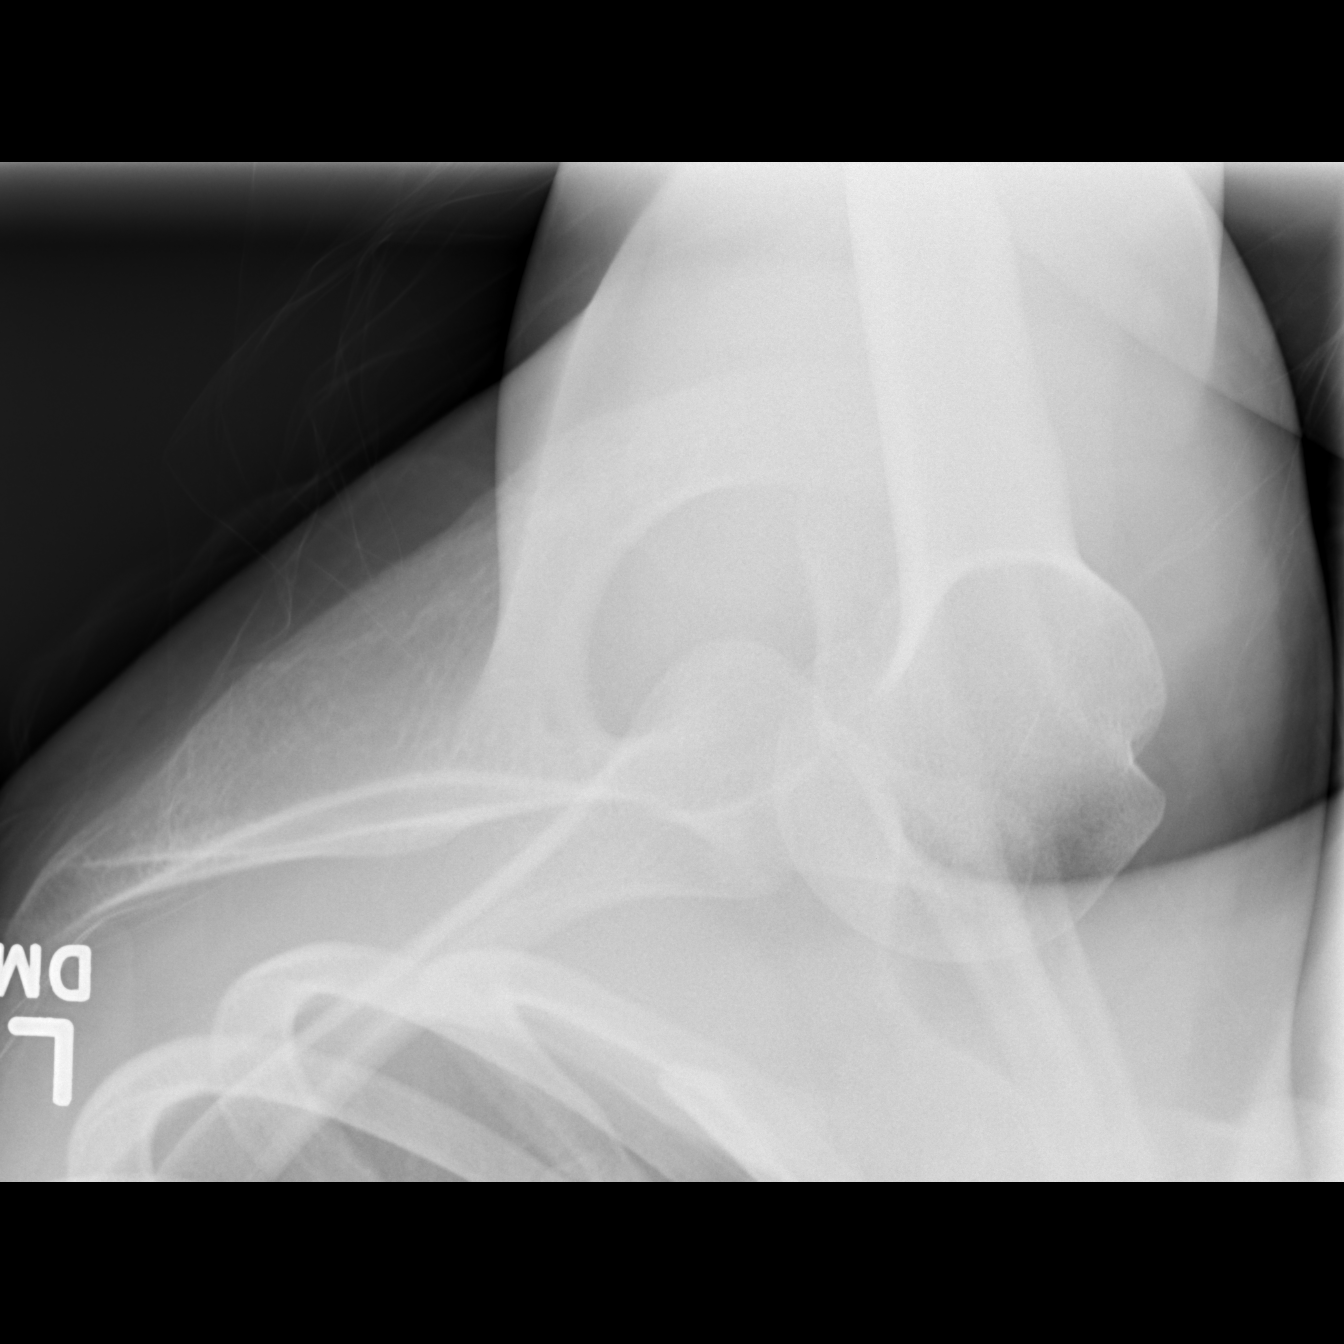

[3 of 3 positions shown; findings below may reference images not displayed]

FINDINGS: Bone mineralization is within normal limits. No glenohumeral joint
dislocation. Proximal left humerus intact. Left clavicle and scapula
intact. Negative visualized ribs and lung parenchyma.
IMPRESSION: Negative.
